# Patient Record
Sex: Male | Born: 1975 | Race: White | Hispanic: No | Marital: Single | Smoking: Former smoker
Health system: Southern US, Community
[De-identification: ages and names within clinical notes are randomized; demographics above are authoritative.]

## PROBLEM LIST (undated history)

## (undated) DIAGNOSIS — F419 Anxiety disorder, unspecified: Secondary | ICD-10-CM

## (undated) DIAGNOSIS — F32A Depression, unspecified: Secondary | ICD-10-CM

## (undated) DIAGNOSIS — F4310 Post-traumatic stress disorder, unspecified: Secondary | ICD-10-CM

## (undated) DIAGNOSIS — C6290 Malignant neoplasm of unspecified testis, unspecified whether descended or undescended: Secondary | ICD-10-CM

## (undated) DIAGNOSIS — F329 Major depressive disorder, single episode, unspecified: Secondary | ICD-10-CM

## (undated) HISTORY — PX: ORCHIECTOMY: SHX2116

## (undated) HISTORY — PX: KNEE SURGERY: SHX244

## (undated) HISTORY — PX: APPENDECTOMY: SHX54

---

## 2014-04-25 ENCOUNTER — Emergency Department (HOSPITAL_BASED_OUTPATIENT_CLINIC_OR_DEPARTMENT_OTHER)
Admission: EM | Admit: 2014-04-25 | Discharge: 2014-04-25 | Disposition: A | Discharge status: Home / Self Care | Payer: Medicaid Other | Attending: Emergency Medicine | Admitting: Emergency Medicine

## 2014-04-25 ENCOUNTER — Encounter (HOSPITAL_BASED_OUTPATIENT_CLINIC_OR_DEPARTMENT_OTHER): Payer: Self-pay | Admitting: *Deleted

## 2014-04-25 ENCOUNTER — Emergency Department (HOSPITAL_BASED_OUTPATIENT_CLINIC_OR_DEPARTMENT_OTHER): Payer: Medicaid Other

## 2014-04-25 DIAGNOSIS — Z8547 Personal history of malignant neoplasm of testis: Secondary | ICD-10-CM | POA: Insufficient documentation

## 2014-04-25 DIAGNOSIS — R0602 Shortness of breath: Secondary | ICD-10-CM | POA: Diagnosis not present

## 2014-04-25 DIAGNOSIS — Z87891 Personal history of nicotine dependence: Secondary | ICD-10-CM | POA: Diagnosis not present

## 2014-04-25 DIAGNOSIS — F4310 Post-traumatic stress disorder, unspecified: Secondary | ICD-10-CM | POA: Insufficient documentation

## 2014-04-25 DIAGNOSIS — Z79899 Other long term (current) drug therapy: Secondary | ICD-10-CM | POA: Insufficient documentation

## 2014-04-25 DIAGNOSIS — R52 Pain, unspecified: Secondary | ICD-10-CM

## 2014-04-25 DIAGNOSIS — R0789 Other chest pain: Secondary | ICD-10-CM

## 2014-04-25 DIAGNOSIS — F329 Major depressive disorder, single episode, unspecified: Secondary | ICD-10-CM | POA: Insufficient documentation

## 2014-04-25 DIAGNOSIS — R079 Chest pain, unspecified: Secondary | ICD-10-CM | POA: Insufficient documentation

## 2014-04-25 DIAGNOSIS — R109 Unspecified abdominal pain: Secondary | ICD-10-CM | POA: Diagnosis present

## 2014-04-25 DIAGNOSIS — R51 Headache: Secondary | ICD-10-CM | POA: Insufficient documentation

## 2014-04-25 DIAGNOSIS — F419 Anxiety disorder, unspecified: Secondary | ICD-10-CM | POA: Insufficient documentation

## 2014-04-25 HISTORY — DX: Major depressive disorder, single episode, unspecified: F32.9

## 2014-04-25 HISTORY — DX: Depression, unspecified: F32.A

## 2014-04-25 HISTORY — DX: Post-traumatic stress disorder, unspecified: F43.10

## 2014-04-25 HISTORY — DX: Anxiety disorder, unspecified: F41.9

## 2014-04-25 HISTORY — DX: Malignant neoplasm of unspecified testis, unspecified whether descended or undescended: C62.90

## 2014-04-25 LAB — CBC WITH DIFFERENTIAL/PLATELET
BASOS ABS: 0 10*3/uL (ref 0.0–0.1)
BASOS PCT: 0 % (ref 0–1)
EOS ABS: 0.1 10*3/uL (ref 0.0–0.7)
EOS PCT: 1 % (ref 0–5)
HCT: 41.2 % (ref 39.0–52.0)
Hemoglobin: 13.8 g/dL (ref 13.0–17.0)
LYMPHS PCT: 27 % (ref 12–46)
Lymphs Abs: 2.2 10*3/uL (ref 0.7–4.0)
MCH: 30.3 pg (ref 26.0–34.0)
MCHC: 33.5 g/dL (ref 30.0–36.0)
MCV: 90.4 fL (ref 78.0–100.0)
Monocytes Absolute: 1.1 10*3/uL — ABNORMAL HIGH (ref 0.1–1.0)
Monocytes Relative: 13 % — ABNORMAL HIGH (ref 3–12)
Neutro Abs: 4.7 10*3/uL (ref 1.7–7.7)
Neutrophils Relative %: 59 % (ref 43–77)
PLATELETS: 206 10*3/uL (ref 150–400)
RBC: 4.56 MIL/uL (ref 4.22–5.81)
RDW: 12.6 % (ref 11.5–15.5)
WBC: 8.2 10*3/uL (ref 4.0–10.5)

## 2014-04-25 LAB — URINALYSIS, ROUTINE W REFLEX MICROSCOPIC
Bilirubin Urine: NEGATIVE
Glucose, UA: 250 mg/dL — AB
HGB URINE DIPSTICK: NEGATIVE
KETONES UR: NEGATIVE mg/dL
Leukocytes, UA: NEGATIVE
Nitrite: NEGATIVE
Protein, ur: NEGATIVE mg/dL
Specific Gravity, Urine: 1.009 (ref 1.005–1.030)
UROBILINOGEN UA: 1 mg/dL (ref 0.0–1.0)
pH: 5.5 (ref 5.0–8.0)

## 2014-04-25 LAB — COMPREHENSIVE METABOLIC PANEL
ALT: 16 U/L (ref 0–53)
AST: 17 U/L (ref 0–37)
Albumin: 3.7 g/dL (ref 3.5–5.2)
Alkaline Phosphatase: 81 U/L (ref 39–117)
Anion gap: 12 (ref 5–15)
BUN: 14 mg/dL (ref 6–23)
CALCIUM: 9.2 mg/dL (ref 8.4–10.5)
CO2: 29 meq/L (ref 19–32)
CREATININE: 1.1 mg/dL (ref 0.50–1.35)
Chloride: 103 mEq/L (ref 96–112)
GFR calc Af Amer: >90 mL/min (ref >90)
GFR calc non Af Amer: 85 mL/min — ABNORMAL LOW (ref >90)
Glucose, Bld: 96 mg/dL (ref 70–99)
Potassium: 4 mEq/L (ref 3.7–5.3)
SODIUM: 144 meq/L (ref 137–147)
TOTAL PROTEIN: 7.2 g/dL (ref 6.0–8.3)
Total Bilirubin: 0.3 mg/dL (ref 0.3–1.2)

## 2014-04-25 MED ORDER — HYDROCODONE-ACETAMINOPHEN 5-325 MG PO TABS: 1.0000 | ORAL_TABLET | Freq: Four times a day (QID) | ORAL | Status: AC | PRN

## 2014-04-25 MED ORDER — HYDROCODONE-ACETAMINOPHEN 5-325 MG PO TABS
1.0000 | ORAL_TABLET | Freq: Once | ORAL | Status: AC
  Administered 2014-04-25: 1 via ORAL
  Filled 2014-04-25: qty 1

## 2014-04-25 MED ORDER — NAPROXEN 500 MG PO TABS: 500.0000 mg | ORAL_TABLET | Freq: Two times a day (BID) | ORAL | Status: AC

## 2014-04-25 NOTE — ED Provider Notes (Signed)
CSN: [id number]     Arrival date & time 04/25/14  1736 History  This chart was scribed for Fredia Sorrow, MD by Martinique Peace, ED Scribe. The patient was seen in [facility]. The patient's care was started at 6:19 PM.    Chief Complaint  Patient presents with  . Flank Pain      Patient is a 38 y.o. male presenting with flank pain. The history is provided by the patient. No language interpreter was used.  Flank Pain Associated symptoms include chest pain, headaches and shortness of breath.  HPI Comments: Lawrence Edwards is a 38 y.o. male who presents to the Emergency Department complaining of radiating pain to right sided lateral part of chest that extends to medial aspect of chest. Pain is exacerbated with movement of right arm. He denies any mechanism of injury that may be responsible. He reports history of lung disease and cancer. Pt does have PCP and pulmonologist that he follows up with regularly. Pt reports that he does not smoke tobacco cigarettes but does smoke electronic cigarettes.    Past Medical History  Diagnosis Date  . Testicular cancer   . PTSD (post-traumatic stress disorder)   . Anxiety   . Depression    Past Surgical History  Procedure Laterality Date  . Orchiectomy Left   . Appendectomy    . Knee surgery     No family history on file. History  Substance Use Topics  . Smoking status: Former Research scientist (life sciences)  . Smokeless tobacco: Never Used  . Alcohol Use: No     Comment: uses electronic cigarette    Review of Systems  Constitutional: Negative for fever and chills.  HENT: Negative for sore throat.   Respiratory: Positive for shortness of breath.   Cardiovascular: Positive for chest pain.  Gastrointestinal: Negative for nausea and vomiting.  Genitourinary: Positive for flank pain. Negative for dysuria.  Musculoskeletal: Negative for back pain and neck pain.       Radiating right-sided chest pain.  Skin: Negative for rash.  Neurological: Positive for headaches.   Hematological: Does not bruise/bleed easily.  Psychiatric/Behavioral: Negative for confusion.      Allergies  Morphine and related  Home Medications   Prior to Admission medications   Medication Sig Start Date End Date Taking? Authorizing Provider  Buprenorphine HCl-Naloxone HCl 12-3 MG FILM Place under the tongue 3 (three) times daily.   Yes Historical Provider, MD  desvenlafaxine (PRISTIQ) 50 MG 24 hr tablet Take 50 mg by mouth daily.   Yes Historical Provider, MD  diazepam (VALIUM) 10 MG tablet Take 10 mg by mouth every 6 (six) hours as needed for anxiety.   Yes Historical Provider, MD  traMADol (ULTRAM) 50 MG tablet Take by mouth every 6 (six) hours as needed.   Yes Historical Provider, MD  traZODone (DESYREL) 50 MG tablet Take 50 mg by mouth at bedtime.   Yes Historical Provider, MD  zolpidem (AMBIEN) 10 MG tablet Take 10 mg by mouth at bedtime as needed for sleep.   Yes Historical Provider, MD  HYDROcodone-acetaminophen (NORCO/VICODIN) 5-325 MG per tablet Take 1-2 tablets by mouth every 6 (six) hours as needed for moderate pain. 04/25/14   Fredia Sorrow, MD  naproxen (NAPROSYN) 500 MG tablet Take 1 tablet (500 mg total) by mouth 2 (two) times daily. 04/25/14   Fredia Sorrow, MD   BP 123/64 mmHg  Pulse 88  Temp(Src) 98.4 F (36.9 C) (Oral)  Resp 18  Ht 6\' 1"  (1.854 m)  Wt 205  lb (92.987 kg)  BMI 27.05 kg/m2  SpO2 98% Physical Exam  Constitutional: He is oriented to person, place, and time. He appears well-developed and well-nourished. No distress.  HENT:  Head: Normocephalic and atraumatic.  Eyes: Conjunctivae and EOM are normal. Pupils are equal, round, and reactive to light. No scleral icterus.  Neck: Normal range of motion. Neck supple. No tracheal deviation present.  Cardiovascular: Normal rate and normal heart sounds.   Pulmonary/Chest: Effort normal and breath sounds normal. No respiratory distress. He has no wheezes. He has no rales.  Abdominal: Soft. Bowel  sounds are normal. There is no tenderness.  Musculoskeletal: Normal range of motion. He exhibits tenderness.  Reproducible right sided chest tenderness.   Lymphadenopathy:    He has no cervical adenopathy.  Neurological: He is alert and oriented to person, place, and time.  Skin: Skin is warm and dry.  Psychiatric: He has a normal mood and affect. His behavior is normal.  Nursing note and vitals reviewed.   ED Course  Procedures (including critical care time) Labs Review Labs Reviewed  URINALYSIS, ROUTINE W REFLEX MICROSCOPIC - Abnormal; Notable for the following:    Glucose, UA 250 (*)    All other components within normal limits  CBC WITH DIFFERENTIAL - Abnormal; Notable for the following:    Monocytes Relative 13 (*)    Monocytes Absolute 1.1 (*)    All other components within normal limits  COMPREHENSIVE METABOLIC PANEL - Abnormal; Notable for the following:    GFR calc non Af Amer 85 (*)    All other components within normal limits   Results for orders placed or performed during the hospital encounter of 04/25/14  Urinalysis, Routine w reflex microscopic  Result Value Ref Range   Color, Urine YELLOW YELLOW   APPearance CLEAR CLEAR   Specific Gravity, Urine 1.009 1.005 - 1.030   pH 5.5 5.0 - 8.0   Glucose, UA 250 (A) NEGATIVE mg/dL   Hgb urine dipstick NEGATIVE NEGATIVE   Bilirubin Urine NEGATIVE NEGATIVE   Ketones, ur NEGATIVE NEGATIVE mg/dL   Protein, ur NEGATIVE NEGATIVE mg/dL   Urobilinogen, UA 1.0 0.0 - 1.0 mg/dL   Nitrite NEGATIVE NEGATIVE   Leukocytes, UA NEGATIVE NEGATIVE  CBC with Differential  Result Value Ref Range   WBC 8.2 4.0 - 10.5 K/uL   RBC 4.56 4.22 - 5.81 MIL/uL   Hemoglobin 13.8 13.0 - 17.0 g/dL   HCT 41.2 39.0 - 52.0 %   MCV 90.4 78.0 - 100.0 fL   MCH 30.3 26.0 - 34.0 pg   MCHC 33.5 30.0 - 36.0 g/dL   RDW 12.6 11.5 - 15.5 %   Platelets 206 150 - 400 K/uL   Neutrophils Relative % 59 43 - 77 %   Neutro Abs 4.7 1.7 - 7.7 K/uL   Lymphocytes  Relative 27 12 - 46 %   Lymphs Abs 2.2 0.7 - 4.0 K/uL   Monocytes Relative 13 (H) 3 - 12 %   Monocytes Absolute 1.1 (H) 0.1 - 1.0 K/uL   Eosinophils Relative 1 0 - 5 %   Eosinophils Absolute 0.1 0.0 - 0.7 K/uL   Basophils Relative 0 0 - 1 %   Basophils Absolute 0.0 0.0 - 0.1 K/uL  Comprehensive metabolic panel  Result Value Ref Range   Sodium 144 137 - 147 mEq/L   Potassium 4.0 3.7 - 5.3 mEq/L   Chloride 103 96 - 112 mEq/L   CO2 29 19 - 32 mEq/L   Glucose,  Bld 96 70 - 99 mg/dL   BUN 14 6 - 23 mg/dL   Creatinine, Ser 1.10 0.50 - 1.35 mg/dL   Calcium 9.2 8.4 - 10.5 mg/dL   Total Protein 7.2 6.0 - 8.3 g/dL   Albumin 3.7 3.5 - 5.2 g/dL   AST 17 0 - 37 U/L   ALT 16 0 - 53 U/L   Alkaline Phosphatase 81 39 - 117 U/L   Total Bilirubin 0.3 0.3 - 1.2 mg/dL   GFR calc non Af Amer 85 (L) >90 mL/min   GFR calc Af Amer >90 >90 mL/min   Anion gap 12 5 - 15     Imaging Review Dg Ribs Unilateral W/chest Right  04/25/2014   CLINICAL DATA:  Right-sided rib pain for 2 days.  No known trauma.  EXAM: RIGHT RIBS AND CHEST - 3+ VIEW  COMPARISON:  None.  FINDINGS: Normal cardiac and mediastinal contours. Low lung volumes. No consolidative pulmonary opacities. No pleural effusion or pneumothorax.  No evidence for acute displaced rib fracture.  IMPRESSION: No acute cardiopulmonary process.  No evidence for acute displaced rib fracture.   Electronically Signed   By: Lovey Newcomer M.D.   On: 04/25/2014 19:09     EKG Interpretation   Date/Time:  Saturday April 25 2014 18:45:53 EST Ventricular Rate:  74 PR Interval:  154 QRS Duration: 108 QT Interval:  350 QTC Calculation: 388 R Axis:   32 Text Interpretation:  Normal sinus rhythm Nonspecific T wave abnormality  Abnormal ECG No previous ECGs available Confirmed by Braxley Balandran  MD, Rachel Rison  ([id number]) on 04/25/2014 6:48:44 PM     Medications  HYDROcodone-acetaminophen (NORCO/VICODIN) 5-325 MG per tablet 1 tablet (not administered)    6:24 PM-  Treatment plan was discussed with patient who verbalizes understanding and agrees.   MDM   Final diagnoses:  Pain  Right-sided chest wall pain    Workup with negative findings for pneumonia pneumothorax. No significant lab abnormalities. EKG without any acute cardiac changes. Symptoms seem to be related to right-sided chest wall pain may be anterior cartilage rib pain.  I personally performed the services described in this documentation, which was scribed in my presence. The recorded information has been reviewed and is accurate.     Fredia Sorrow, MD 04/25/14 2024

## 2014-04-25 NOTE — ED Notes (Signed)
Reports right side rib pain x 2 days- denies injury- pain worse with movement of right arm-

## 2014-04-25 NOTE — Discharge Instructions (Signed)
Workup without any obvious abnormalities but symptoms consistent with right-sided rib pain. May be due to the cartilage in the ribs. Take the Naprosyn as directed. Take the hydrocodone as needed for supplemental pain relief. Make plans to follow up with your regular doctor. If this is a rib injury can be bothersome for several weeks.

## 2015-07-20 IMAGING — CR DG RIBS W/ CHEST 3+V*R*
5 series · 5 of 5 positions shown · non-contrast
Comparison: None.

CLINICAL DATA: Right-sided rib pain for 2 days.  No known trauma.

EXAM:
RIGHT RIBS AND CHEST - 3+ VIEW

[w chest pa]
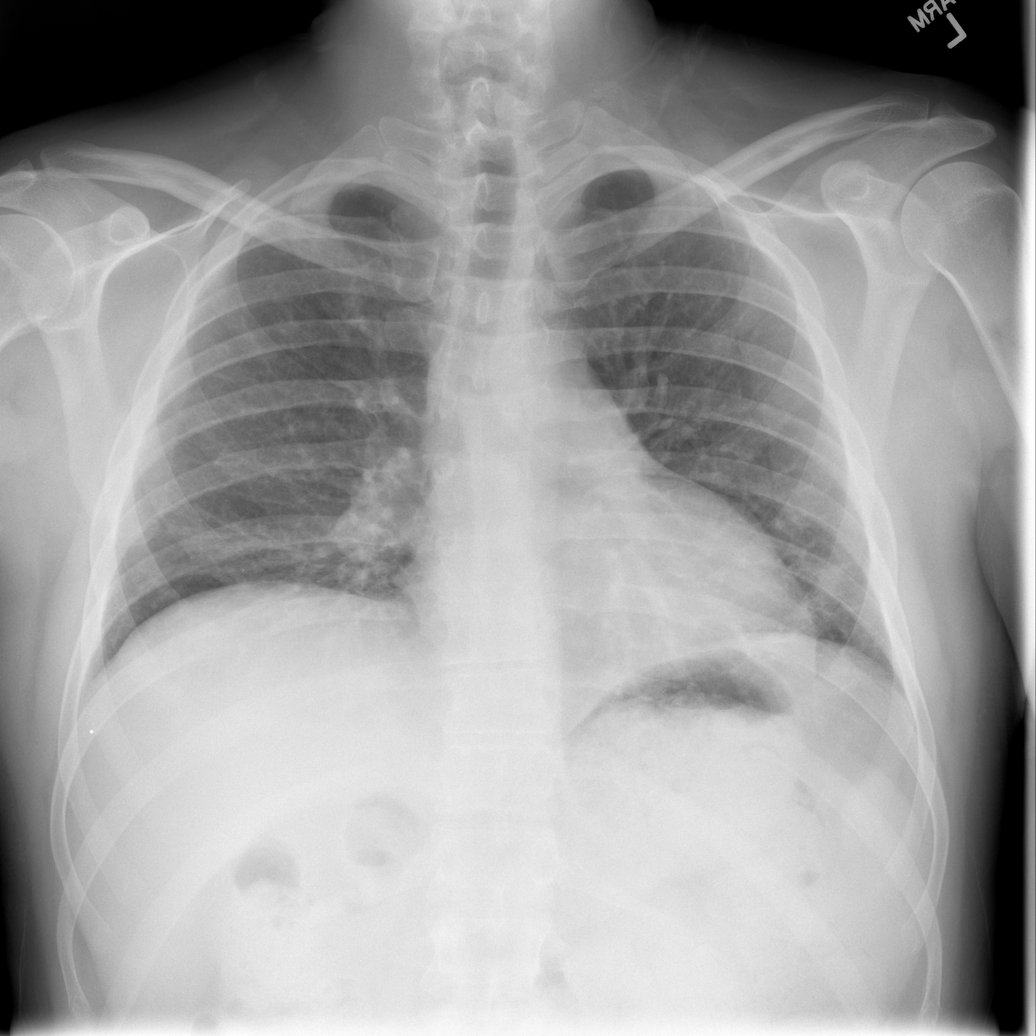

[w ribs ap/pa upper right]
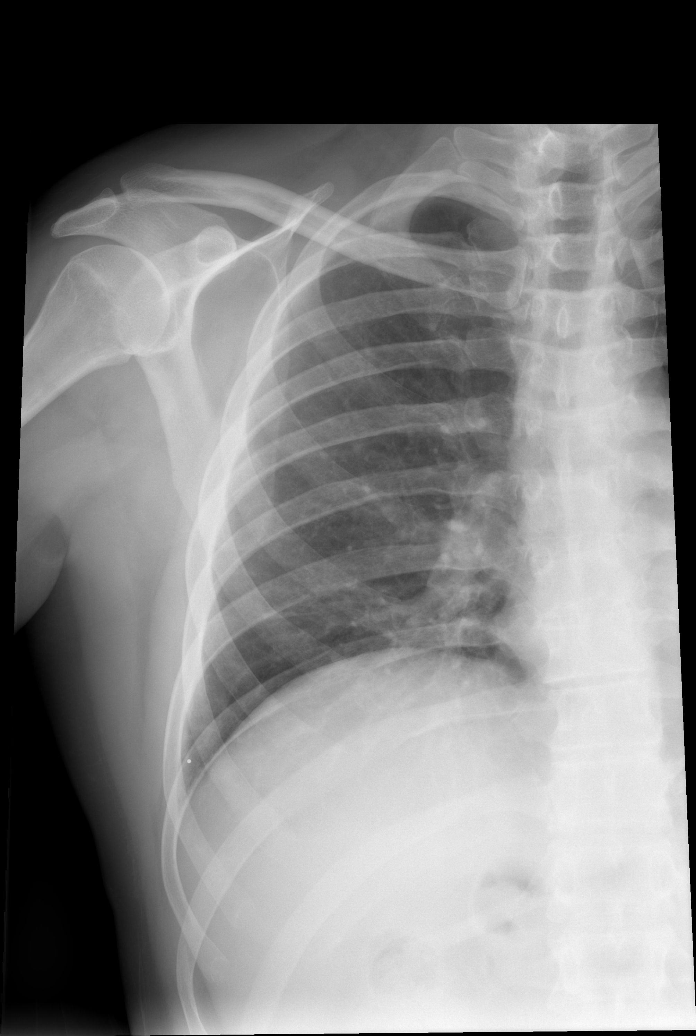

[w ribs ap/pa lower right (1 of 2)]
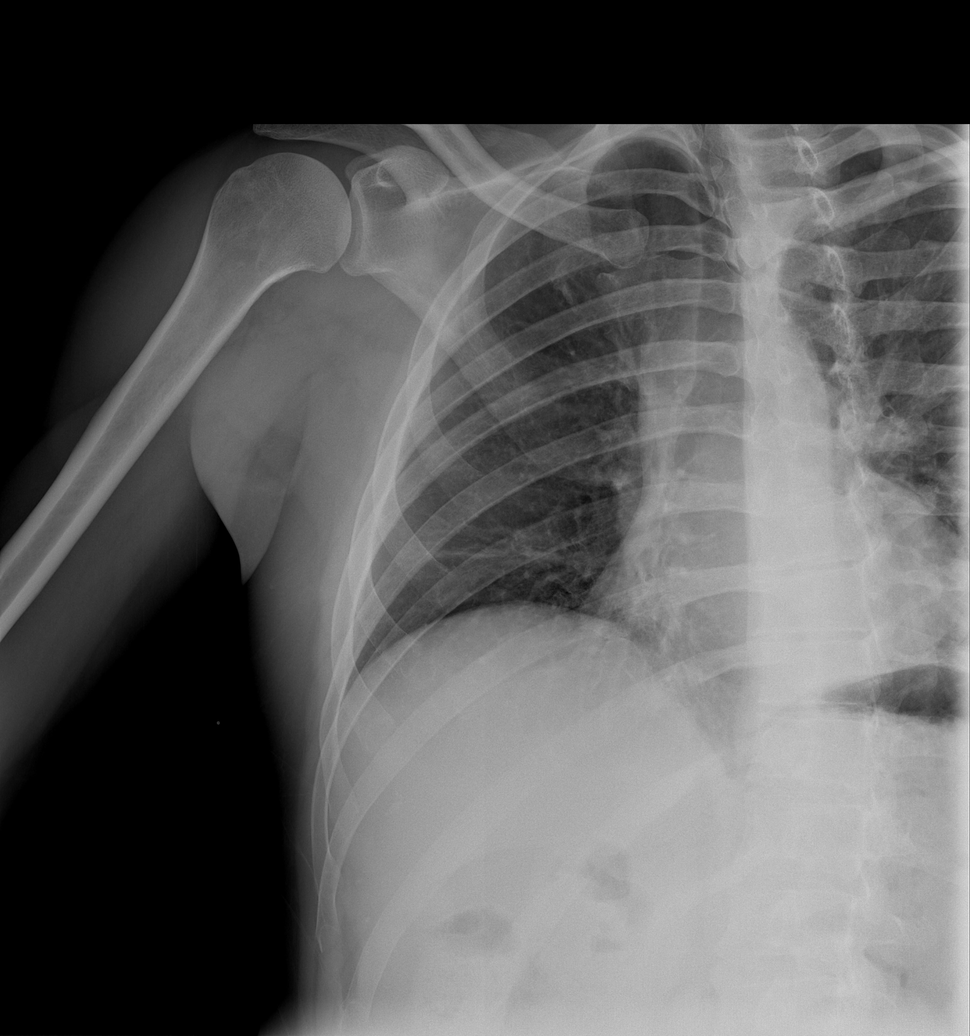

[w ribs oblique right]
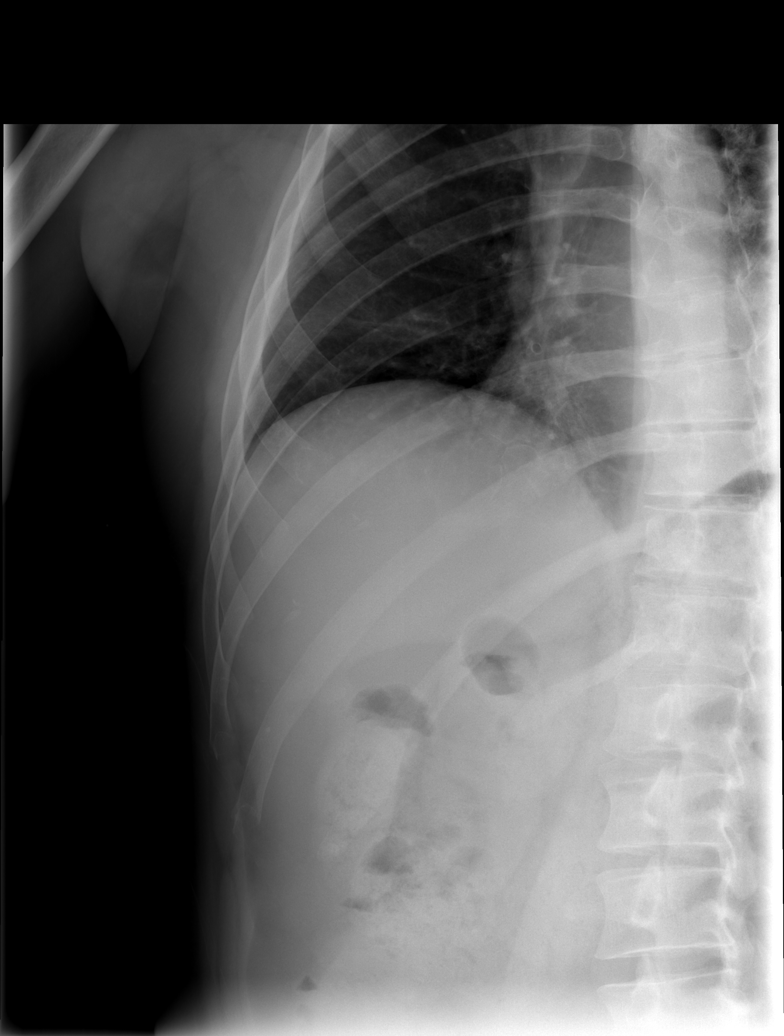

[w ribs ap/pa lower right (2 of 2)]
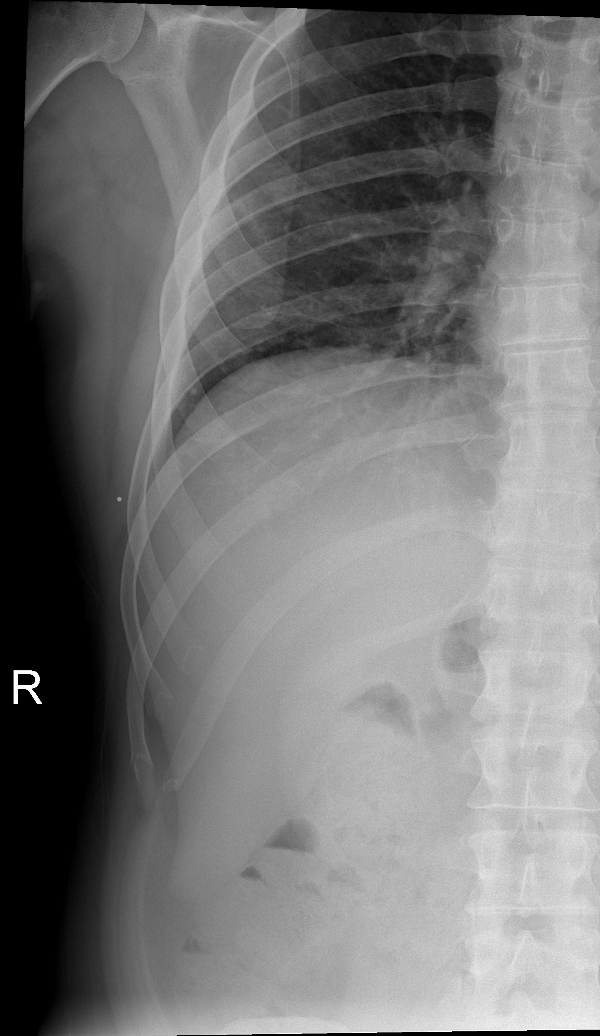

[5 of 5 positions shown; findings below may reference images not displayed]

FINDINGS: Normal cardiac and mediastinal contours. Low lung volumes. No
consolidative pulmonary opacities. No pleural effusion or
pneumothorax.

No evidence for acute displaced rib fracture.
IMPRESSION: No acute cardiopulmonary process.

No evidence for acute displaced rib fracture.
